# Patient Record
Sex: Male | Born: 1980 | Race: Asian | Hispanic: No | Marital: Married | Smoking: Never smoker
Health system: Southern US, Community
[De-identification: ages and names within clinical notes are randomized; demographics above are authoritative.]

## PROBLEM LIST (undated history)

## (undated) DIAGNOSIS — K219 Gastro-esophageal reflux disease without esophagitis: Secondary | ICD-10-CM

## (undated) HISTORY — DX: Gastro-esophageal reflux disease without esophagitis: K21.9

---

## 2007-03-16 ENCOUNTER — Emergency Department (HOSPITAL_COMMUNITY): Admission: EM | Admit: 2007-03-16 | Discharge: 2007-03-16 | Payer: Self-pay | Admitting: Emergency Medicine

## 2008-03-31 ENCOUNTER — Ambulatory Visit: Payer: Self-pay | Admitting: Family Medicine

## 2008-03-31 DIAGNOSIS — K219 Gastro-esophageal reflux disease without esophagitis: Secondary | ICD-10-CM | POA: Insufficient documentation

## 2008-04-27 ENCOUNTER — Ambulatory Visit: Payer: Self-pay | Admitting: Family Medicine

## 2008-04-27 DIAGNOSIS — R5383 Other fatigue: Secondary | ICD-10-CM

## 2008-04-27 DIAGNOSIS — R5381 Other malaise: Secondary | ICD-10-CM | POA: Insufficient documentation

## 2008-04-28 LAB — CONVERTED CEMR LAB
Albumin: 4.1 g/dL (ref 3.5–5.2)
BUN: 13 mg/dL (ref 6–23)
Calcium: 9.2 mg/dL (ref 8.4–10.5)
Cholesterol: 181 mg/dL (ref 0–200)
Creatinine, Ser: 1 mg/dL (ref 0.4–1.5)
Eosinophils Relative: 5.6 % — ABNORMAL HIGH (ref 0.0–5.0)
GFR calc non Af Amer: 93.93 mL/min (ref >60)
Glucose, Bld: 93 mg/dL (ref 70–99)
HCT: 40.2 % (ref 39.0–52.0)
HDL: 39.5 mg/dL (ref >39.00)
Hemoglobin: 14.2 g/dL (ref 13.0–17.0)
Lymphs Abs: 2.4 10*3/uL (ref 0.7–4.0)
Monocytes Relative: 7.7 % (ref 3.0–12.0)
Neutro Abs: 2.9 10*3/uL (ref 1.4–7.7)
Platelets: 167 10*3/uL (ref 150.0–400.0)
VLDL: 18 mg/dL (ref 0.0–40.0)
WBC: 6.1 10*3/uL (ref 4.5–10.5)

## 2009-03-03 ENCOUNTER — Ambulatory Visit: Payer: Self-pay | Admitting: Family Medicine

## 2009-03-09 ENCOUNTER — Ambulatory Visit: Payer: Self-pay | Admitting: Family Medicine

## 2009-03-09 DIAGNOSIS — L989 Disorder of the skin and subcutaneous tissue, unspecified: Secondary | ICD-10-CM | POA: Insufficient documentation

## 2009-03-09 DIAGNOSIS — D239 Other benign neoplasm of skin, unspecified: Secondary | ICD-10-CM | POA: Insufficient documentation

## 2009-03-20 ENCOUNTER — Ambulatory Visit: Payer: Self-pay | Admitting: Family Medicine

## 2010-02-13 NOTE — Assessment & Plan Note (Signed)
Summary: CPX/CLE   Vital Signs:  Patient profile:   30 year old male Height:      71 inches Weight:      208.4 pounds BMI:     29.17 Temp:     98.2 degrees F Pulse rate:   80 / minute Pulse rhythm:   regular BP sitting:   130 / 90  (left arm) Cuff size:   large  Vitals Entered By: Benny Lennert CMA Duncan Dull) (March 03, 2009 2:43 PM)  Nutrition Counseling: Patient's BMI is greater than 25 and therefore counseled on weight management options.  History of Present Illness: Chief complaint cpx  61 male:  Doing well, question of mole on L shoulder, growing, getting more dark  Preventive Screening-Counseling & Management  Alcohol-Tobacco     Alcohol drinks/day: <1     Alcohol Counseling: not indicated; use of alcohol is not excessive or problematic     Smoking Status: never     Tobacco Counseling: not indicated; no tobacco use  Caffeine-Diet-Exercise     Diet Comments: good     Diet Counseling: to improve diet; diet is suboptimal     Does Patient Exercise: yes     Type of exercise: cycling     Times/week: 5  Hep-HIV-STD-Contraception     STD Risk: no risk noted     Dental Visit-last 6 months yes     Testicular SE Education/Counseling to perform regular STE     Sun Exposure-Excessive: yes     Sun Exposure Counseling: to decrease sun exposure      Sexual History:  currently monogamous.        Drug Use:  never.    Clinical Review Panels:  Lipid Management   Cholesterol:  181 (04/27/2008)   LDL (bad choesterol):  124 (04/27/2008)   HDL (good cholesterol):  39.50 (04/27/2008)  CBC   WBC:  6.1 (04/27/2008)   RBC:  4.66 (04/27/2008)   Hgb:  14.2 (04/27/2008)   Hct:  40.2 (04/27/2008)   Platelets:  167.0 (04/27/2008)   MCV  86.2 (04/27/2008)   MCHC  35.3 (04/27/2008)   RDW  12.3 (04/27/2008)   PMN:  46.6 (04/27/2008)   Lymphs:  39.7 (04/27/2008)   Monos:  7.7 (04/27/2008)   Eosinophils:  5.6 (04/27/2008)   Basophil:  0.4 (04/27/2008)  Complete Metabolic  Panel   Glucose:  93 (04/27/2008)   Sodium:  141 (04/27/2008)   Potassium:  4.0 (04/27/2008)   Chloride:  107 (04/27/2008)   CO2:  30 (04/27/2008)   BUN:  13 (04/27/2008)   Creatinine:  1.0 (04/27/2008)   Albumin:  4.1 (04/27/2008)   Total Protein:  7.3 (04/27/2008)   Calcium:  9.2 (04/27/2008)   Total Bili:  1.2 (04/27/2008)   Alk Phos:  57 (04/27/2008)   SGPT (ALT):  19 (04/27/2008)   SGOT (AST):  19 (04/27/2008)   Allergies (verified): No Known Drug Allergies  Past History:  Past medical, surgical, family and social histories (including risk factors) reviewed, and no changes noted (except as noted below).  Past Medical History: Reviewed history from 03/31/2008 and no changes required. GERD  Past Surgical History: Reviewed history from 03/31/2008 and no changes required. none  Family History: Reviewed history from 03/31/2008 and no changes required. Family History High cholesterol Family History Hypertension DM, uncle hypothyroid, uncle  Social History: Reviewed history from 03/31/2008 and no changes required. Marital Status: Married Children: 1 upcoming Occupation: Teacher, early years/pre Never Smoked Alcohol use-yes Drug use-no Regular exercise-yes (avid cyclist) STD  Risk:  no risk noted Dental Care w/in 6 mos.:  yes Sun Exposure-Excessive:  yes Sexual History:  currently monogamous Drug Use:  never  Review of Systems  General: Denies fever, chills, sweats, anorexia, fatigue, weakness, malaise Eyes: Denies blurring, vision loss ENT: Denies earache, nasal congestion, nosebleeds, sore throat, and hoarseness.  Cardiovascular: Denies chest pains, palpitations, syncope, dyspnea on exertion,  Respiratory: Denies cough, dyspnea at rest, excessive sputum,wheeezing GI: Denies nausea, vomiting, diarrhea, constipation, change in bowel habits, abdominal pain, melena, hematochezia GU: Denies dysuria, hematuria, discharge, urinary frequency, urinary hesitancy, nocturia,  incontinence, genital sores, decreased libido Musculoskeletal: Denies back pain, joint pain Derm: as above Neuro: Denies  paresthesias, frequent falls, frequent headaches, and difficulty walking.  Psych: Denies depression, anxiety Endocrine: Denies cold intolerance, heat intolerance, polydipsia, polyphagia, polyuria, and unusual weight change.  Heme: Denies enlarged lymph nodes Allergy: No hayfever   Otherwise, the pertinent positives and negatives are listed above and in the HPI, otherwise a full review of systems has been reviewed and is negative unless noted positive.   Physical Exam  General:  Well-developed,well-nourished,in no acute distress; alert,appropriate and cooperative throughout examination Head:  Normocephalic and atraumatic without obvious abnormalities. No apparent alopecia or balding. Eyes:  vision grossly intact, pupils equal, pupils round, pupils reactive to light, pupils react to accomodation, and corneas and lenses clear.   Ears:  External ear exam shows no significant lesions or deformities.  Otoscopic examination reveals clear canals, tympanic membranes are intact bilaterally without bulging, retraction, inflammation or discharge. Hearing is grossly normal bilaterally. Nose:  External nasal examination shows no deformity or inflammation. Nasal mucosa are pink and moist without lesions or exudates. Mouth:  Oral mucosa and oropharynx without lesions or exudates.  Teeth in good repair. Neck:  No deformities, masses, or tenderness noted. Chest Wall:  No deformities, masses, tenderness or gynecomastia noted. Lungs:  Normal respiratory effort, chest expands symmetrically. Lungs are clear to auscultation, no crackles or wheezes. Heart:  Normal rate and regular rhythm. S1 and S2 normal without gallop, murmur, click, rub or other extra sounds. Abdomen:  Bowel sounds positive,abdomen soft and non-tender without masses, organomegaly or hernias noted. Genitalia:  Testes  bilaterally descended without nodularity, tenderness or masses. No scrotal masses or lesions. No penis lesions or urethral discharge. Msk:  normal ROM and no crepitation.   Pulses:  R and L carotid are full and equal bilaterally Extremities:  No clubbing, cyanosis, edema, or deformity noted with normal full range of motion of all joints.   Neurologic:  alert & oriented X3, sensation intact to light touch, and gait normal.   Skin:  L shoulder with elevated dark mole with irregular borders Cervical Nodes:  No lymphadenopathy noted Psych:  Cognition and judgment appear intact. Alert and cooperative with normal attention span and concentration. No apparent delusions, illusions, hallucinations   Impression & Recommendations:  Problem # 1:  HEALTH MAINTENANCE EXAM (ICD-V70.0) The patient's preventative maintenance and recommended screening tests for an annual wellness exam were reviewed in full today. Brought up to date unless services declined.  Counselled on the importance of diet, exercise, and its role in overall health and mortality. The patient's FH and SH was reviewed, including their home life, tobacco status, and drug and alcohol status.   L mole concern - I am going to bring him back to do a punch biopsy  Patient Instructions: 1)  follow-up in the next couple of weeks, 30 minute procedure appt.   Prior Medications (reviewed today): None  Current Allergies (reviewed today): No known allergies

## 2010-02-13 NOTE — Assessment & Plan Note (Signed)
Summary: biopsy skin per Dr Dezire Turk/mk   Vital Signs:  Patient profile:   30 year old male Height:      71 inches Weight:      206.4 pounds BMI:     28.89 Temp:     97.3 degrees F oral Pulse rate:   80 / minute Pulse rhythm:   regular BP sitting:   120 / 86  (left arm) Cuff size:   large  Vitals Entered By: Benny Lennert CMA Duncan Dull) (March 09, 2009 10:59 AM)  History of Present Illness: Chief complaint: f/u for ellipitical excision per dr Makynzee Tigges   5 x 6 mm lesion ellipitical L shoulder 2 hm 2 interrupted  PROCEDURE ONLY  MISC. Report  Procedure date:  03/12/2009  Findings:      REPORT OF SURGICAL PATHOLOGY   Case #: [id number] Patient Name: Gary Cobb, Gary Cobb Office Chart Number: [id number]   MRN: [id number] Pathologist: Pershing Proud M.D., Sharlet Salina, Dermatopathologist, Electronic Signature DOB/Age [birth year] (Age: 24) Gender: M Date Taken: 03/09/2009 Date Received: 03/09/2009   FINAL DIAGNOSIS ***Microscopic Examination and Diagnosis***   1. SKIN, LEFT SHOULDER : MELANOCYTIC NEVUS,  HYPERPIGMENTED, SEE DESCRIPTION   DATE REPORTED: 03/10/2009 *** Electronically Signed Out by Stahr M.D., Sharlet Salina, Dermatopathologist, Electronic Signature ***   CLINICAL HISTORY   SPECIMEN(S) OBTAINED 1. Skin, Left Shoulder   SPECIMEN COMMENTS: 1. Atypical nevus, r/o melanoma, atypical scc   Gross Description 1. Formalin fixed specimen received:  8 x 5 x 3 mm, toto (3 p) (1 b) ( as )   MICROSCOPIC DESCRIPTION 1. At low magnification this proliferation of cells has pigment superficially.  There is a symmetrical growth pattern with sharp lateral demarcation and no conspicuous intraepidermal spread.  A rare junctional melanocyte is observed but the dermal component is most obvious.  I see one mitotic figure superficially in this material but the lesion again exhibits a symmetrical growth pattern with maturation towards the base.  Some of the nevus cells extend into the  reticular dermis in a splayed pattern suggesting a congential origin.  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  A solitary mitotic figure superficially located probably has no significance.  I do not see expansile growth.  I do not see deep mitotic figures.  I do not see intralesional transformation. Based on this histology i favor a melanocytic nevus, hyperpigmented.    Allergies (verified): No Known Drug Allergies   Impression & Recommendations:  Problem # 1:  SKIN LESION (ICD-709.9) PROCEDURE ONLY  Biopsy, as below, elliptical biopsy with 2 mm margins.   Sent to pathology and reviewed. Results called to patient.  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 Disposition: To home

## 2010-02-13 NOTE — Assessment & Plan Note (Signed)
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

## 2011-01-29 ENCOUNTER — Encounter: Payer: Self-pay | Admitting: Family Medicine

## 2011-01-30 ENCOUNTER — Encounter: Payer: Self-pay | Admitting: Family Medicine

## 2011-01-30 ENCOUNTER — Encounter: Payer: Self-pay | Admitting: *Deleted

## 2011-01-30 ENCOUNTER — Ambulatory Visit (INDEPENDENT_AMBULATORY_CARE_PROVIDER_SITE_OTHER): Payer: Managed Care, Other (non HMO) | Admitting: Family Medicine

## 2011-01-30 VITALS — BP 120/82 | HR 69 | Temp 97.8°F | Wt 182.2 lb

## 2011-01-30 DIAGNOSIS — S060X0A Concussion without loss of consciousness, initial encounter: Secondary | ICD-10-CM
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
## 2011-02-05 ENCOUNTER — Telehealth: Payer: Self-pay | Admitting: *Deleted

## 2011-02-05 NOTE — Telephone Encounter (Signed)
Patient says he saw you last week and was asked to call with a progress report.  He says he is feeling much better, not 100% but much better.  He is returning to work Advertising account executive.

## 2011-02-05 NOTE — Telephone Encounter (Signed)
noted 

## 2011-11-08 ENCOUNTER — Encounter: Payer: Self-pay | Admitting: Family Medicine

## 2011-11-08 ENCOUNTER — Ambulatory Visit (INDEPENDENT_AMBULATORY_CARE_PROVIDER_SITE_OTHER): Payer: BC Managed Care – PPO | Admitting: Family Medicine

## 2011-11-08 VITALS — BP 110/78 | HR 84 | Temp 98.6°F | Wt 179.5 lb

## 2011-11-08 DIAGNOSIS — R509 Fever, unspecified: Secondary | ICD-10-CM

## 2011-11-08 DIAGNOSIS — J029 Acute pharyngitis, unspecified: Secondary | ICD-10-CM

## 2011-11-08 DIAGNOSIS — S63509A Unspecified sprain of unspecified wrist, initial encounter: Secondary | ICD-10-CM

## 2011-11-08 DIAGNOSIS — S66919A Strain of unspecified muscle, fascia and tendon at wrist and hand level, unspecified hand, initial encounter: Secondary | ICD-10-CM

## 2011-11-08 LAB — POCT RAPID STREP A (OFFICE): Rapid Strep A Screen: NEGATIVE

## 2011-11-08 MED ORDER — AMOXICILLIN 875 MG PO TABS: 875.0000 mg | ORAL_TABLET | Freq: Two times a day (BID) | ORAL | Status: DC

## 2011-11-08 NOTE — Progress Notes (Signed)
  Subjective:    Patient ID: Gary Cobb, male    DOB: Jan 14, 1981, 31 y.o.   MRN: [id number]  HPI CC: ST, wrist pain  2d ago started having fever (highest 39 celsius (102.45F) yesterday) with swollen glands.  Now with bad ST.  + chills and HA.  Has been treating with pseudophed.  Mild lightheaded and nauseated this morning.  No cough, congestion, sneezing, RN.  No abd pain, n/v, myalgias, arthralgias.  Children recently started daycare - sick at home. Mother in law smokes at home, outside. No h/o asthma/allergies.  Having wrist pain over last 4-5 days - points to ulnar wrist.  Works at pharmacy - types, opening bottles.  Using wrist braces bilaterally which do help.  Worse pain with ulnar deviation.  Denies paresthesias or weakness in arms/hands.  No neck pain.  Past Medical History  Diagnosis Date  . GERD (gastroesophageal reflux disease)      Review of Systems Per HPI    Objective:   Physical Exam  Nursing note and vitals reviewed. Constitutional: He appears well-developed and well-nourished. No distress.  HENT:  Head: Normocephalic and atraumatic.  Right Ear: Hearing, tympanic membrane, external ear and ear canal normal.  Left Ear: Hearing, tympanic membrane, external ear and ear canal normal.  Nose: Nose normal. No mucosal edema or rhinorrhea. Right sinus exhibits no maxillary sinus tenderness and no frontal sinus tenderness. Left sinus exhibits no maxillary sinus tenderness and no frontal sinus tenderness.  Mouth/Throat: Uvula is midline and mucous membranes are normal. Posterior oropharyngeal edema and posterior oropharyngeal erythema present. No oropharyngeal exudate or tonsillar abscesses.       Erythematous tonsils, some exudate on right side  Eyes: Conjunctivae normal and EOM are normal. Pupils are equal, round, and reactive to light. No scleral icterus.  Neck: Normal range of motion. Neck supple. No thyromegaly present.  Cardiovascular: Normal rate, regular rhythm,  normal heart sounds and intact distal pulses.   No murmur heard. Pulmonary/Chest: Effort normal and breath sounds normal. No respiratory distress. He has no wheezes. He has no rales.  Musculoskeletal:       Minimal pain along TFCC bilaterally. Mild pain with flexion and supination against resistance bilaterally No pain with extension against resistance. No deformity, erythema, induration.  Lymphadenopathy:       Head (right side): Submandibular adenopathy present.       Head (left side): Submandibular adenopathy present.    He has cervical adenopathy.       Right cervical: Posterior cervical adenopathy present.       Left cervical: Posterior cervical adenopathy present.       Tender shotty LAD  Skin: Skin is warm and dry. No rash noted.       Assessment & Plan:

## 2011-11-08 NOTE — Assessment & Plan Note (Signed)
4/4 centor criteria but negative RST. Treat regardless given presentation with amoxicillin course. Update [place] if not improving as expected. Pt agrees with plan.

## 2011-11-08 NOTE — Patient Instructions (Signed)
You have pharyngitis, possibly strep.  Treat with amoxicillin twice daily for 10 days. Push fluids and plenty of rest. May use ibuprofen for throat inflammation and wrist. Salt water gargles. Suck on cold things like popsicles or warm things like herbal teas (whichever soothes the throat better). Return if fever >101.5, worsening pain, or trouble opening/closing mouth, or hoarse voice. For wrist - I think you have wrist sprain.  Treat with ibuprofen and continued wrist support and stretching exercises provided today. Good to see you today, call clinic with questions.

## 2011-11-08 NOTE — Assessment & Plan Note (Signed)
Anticipate TFCC ligament sprain. Provided with stretching exercises from [facility] pt advisor. Use ibuprofen prn.  Use wrist brace for work.  Ice wrist. Update if not improved with this treatment.

## 2011-11-15 ENCOUNTER — Ambulatory Visit (INDEPENDENT_AMBULATORY_CARE_PROVIDER_SITE_OTHER): Payer: BC Managed Care – PPO | Admitting: Family Medicine

## 2011-11-15 ENCOUNTER — Encounter: Payer: Self-pay | Admitting: Family Medicine

## 2011-11-15 VITALS — BP 116/74 | HR 60 | Temp 98.0°F | Wt 177.8 lb

## 2011-11-15 DIAGNOSIS — M25562 Pain in left knee: Secondary | ICD-10-CM | POA: Insufficient documentation

## 2011-11-15 DIAGNOSIS — M25569 Pain in unspecified knee: Secondary | ICD-10-CM

## 2011-11-15 DIAGNOSIS — M25561 Pain in right knee: Secondary | ICD-10-CM | POA: Insufficient documentation

## 2011-11-15 NOTE — Patient Instructions (Addendum)
I think you have runner's knee or possibly resolving baker's cyst. Continue ice to knee, continue aleve (naproxen). Rest knee for the next week, then may try slowly restart training for running.  If pain returns, return to see Dr. Patsy Lager. Good to see you today, call us with questions.
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
## 2011-11-15 NOTE — Assessment & Plan Note (Addendum)
Right greater than left.  Benign exam today. Anticipate patellofemoral pain syndrome, worse with running. rec rest knee for 1 week , continue OTC NSAID, continue ice. May use knee sleeve for more support. Do stretching exercises provided After 1 wk rest, may restart training slowly.  If pain returns or worsens, rec rtc to see PCP, Dr. Salena Saner.

## 2012-01-24 ENCOUNTER — Encounter: Payer: Self-pay | Admitting: Family Medicine

## 2012-01-24 ENCOUNTER — Ambulatory Visit (INDEPENDENT_AMBULATORY_CARE_PROVIDER_SITE_OTHER): Payer: BC Managed Care – PPO | Admitting: Family Medicine

## 2012-01-24 VITALS — BP 118/78 | HR 60 | Temp 98.1°F | Wt 189.2 lb

## 2012-01-24 DIAGNOSIS — J069 Acute upper respiratory infection, unspecified: Secondary | ICD-10-CM | POA: Insufficient documentation

## 2012-01-24 MED ORDER — AZITHROMYCIN 250 MG PO TABS: ORAL_TABLET | ORAL | Status: DC

## 2012-01-24 MED ORDER — BENZONATATE 100 MG PO CAPS
100.0000 mg | ORAL_CAPSULE | Freq: Three times a day (TID) | ORAL | Status: DC | PRN
Start: 2012-01-24 — End: 2012-08-26

## 2012-01-24 NOTE — Patient Instructions (Signed)
I think you have a post-infectious cough. Use medication as prescribed: tessalon perls.  Start antihistamine.  Use ibuprofen as needed for bronchi inflammation. Push fluids and plenty of rest. If not improving over next several days, fill zpack (script provided). Call clinic with questions.  Good to see you today.

## 2012-01-24 NOTE — Assessment & Plan Note (Signed)
Anticipate post-infectious (post-viral) cough.  Discussed this. rec antihistamine, continue tessalon perls, continue mucinex DM. If not better with this, rec fill zpack to cover atypical bronchitis given duration of cough. Pt agrees with plan.

## 2012-01-24 NOTE — Progress Notes (Signed)
  Subjective:    Patient ID: Gary Cobb, male    DOB: [birth year], 32 y.o.   MRN: [id number]  HPI CC: cold sxs  2 wk h/o cough productive of green phlegm worse in am, cold sxs including rhinorrhea.  + chest congestion.  + PNdrainage.  sleeping ok at night.  Minimal coughing fits.  Denies diaphoresis.  Denies post tussive emesis.  plateauing sxs.  So far has tried mucinex DM and tessalon perls.  No fevers/chills, significant congestion, HA, abd pain, nausea/vomiting, ear or tooth pain, rashes, myalgias, arthralgias (other than knees).  Denies SOB, wheezing, chest tightness.  Did receive flu shot this year. Wife sick recently with similar sxs, treated with zpack. No smokers at home. No h/o asthma.  Past Medical History  Diagnosis Date  . GERD (gastroesophageal reflux disease)      Review of Systems Per HPI    Objective:   Physical Exam  Nursing note and vitals reviewed. Constitutional: He appears well-developed and well-nourished. No distress.  HENT:  Head: Normocephalic and atraumatic.  Right Ear: Hearing, tympanic membrane, external ear and ear canal normal.  Left Ear: Hearing, tympanic membrane, external ear and ear canal normal.  Nose: Nose normal. No mucosal edema or rhinorrhea. Right sinus exhibits no maxillary sinus tenderness and no frontal sinus tenderness. Left sinus exhibits no maxillary sinus tenderness and no frontal sinus tenderness.  Mouth/Throat: Uvula is midline and mucous membranes are normal. Posterior oropharyngeal edema and posterior oropharyngeal erythema present. No oropharyngeal exudate or tonsillar abscesses.       Some nasal congestion present + white posterior oropharynx drainage present  Eyes: Conjunctivae normal and EOM are normal. Pupils are equal, round, and reactive to light. No scleral icterus.  Neck: Normal range of motion. Neck supple.  Cardiovascular: Normal rate, regular rhythm, normal heart sounds and intact distal pulses.   No murmur  heard. Pulmonary/Chest: Effort normal and breath sounds normal. No respiratory distress. He has no wheezes. He has no rales.  Lymphadenopathy:    He has no cervical adenopathy.  Skin: Skin is warm and dry. No rash noted.      Assessment & Plan:

## 2012-08-26 ENCOUNTER — Ambulatory Visit (INDEPENDENT_AMBULATORY_CARE_PROVIDER_SITE_OTHER): Payer: BC Managed Care – PPO | Admitting: Family Medicine

## 2012-08-26 ENCOUNTER — Encounter: Payer: Self-pay | Admitting: Family Medicine

## 2012-08-26 VITALS — BP 118/80 | HR 56 | Temp 97.8°F | Ht 71.0 in | Wt 183.0 lb

## 2012-08-26 DIAGNOSIS — M25549 Pain in joints of unspecified hand: Secondary | ICD-10-CM

## 2012-08-26 DIAGNOSIS — M25541 Pain in joints of right hand: Secondary | ICD-10-CM | POA: Insufficient documentation

## 2012-08-26 DIAGNOSIS — M25542 Pain in joints of left hand: Secondary | ICD-10-CM | POA: Insufficient documentation

## 2012-08-26 LAB — CBC WITH DIFFERENTIAL/PLATELET
Basophils Relative: 0.4 % (ref 0.0–3.0)
Eosinophils Absolute: 0.2 10*3/uL (ref 0.0–0.7)
Hemoglobin: 15 g/dL (ref 13.0–17.0)
Lymphs Abs: 2.2 10*3/uL (ref 0.7–4.0)
MCHC: 33.2 g/dL (ref 30.0–36.0)
MCV: 87.1 fl (ref 78.0–100.0)
Monocytes Absolute: 0.5 10*3/uL (ref 0.1–1.0)
Neutro Abs: 4.4 10*3/uL (ref 1.4–7.7)
RBC: 5.2 Mil/uL (ref 4.22–5.81)

## 2012-08-26 LAB — COMPREHENSIVE METABOLIC PANEL
AST: 17 U/L (ref 0–37)
Albumin: 4.4 g/dL (ref 3.5–5.2)
BUN: 15 mg/dL (ref 6–23)
Calcium: 9.7 mg/dL (ref 8.4–10.5)
Chloride: 103 mEq/L (ref 96–112)
Potassium: 4.2 mEq/L (ref 3.5–5.1)
Total Protein: 7.7 g/dL (ref 6.0–8.3)

## 2012-08-26 MED ORDER — IBUPROFEN 800 MG PO TABS: 800.0000 mg | ORAL_TABLET | Freq: Three times a day (TID) | ORAL | Status: DC | PRN

## 2012-08-26 NOTE — Progress Notes (Signed)
  Subjective:    Patient ID: Gary Cobb, male    DOB: [birth year], 32 y.o.   MRN: [id number]  HPI   32 year old male pt of Dr. Patsy Lager presents with new pain in  Left middle PIP joint...swollen and tender in last week.  In last week had some tenderness in B thumb DIP... Better now, but tender in right to palpation  No redness and no swelling.  No fatigue, no fever, no rash.  No new meds, no recent viral infection. Had flu shot three weeks ago.  Father and brother with gout. ? OA in father. No autoimmune diseases.   Has not taken any med for symptoms.   Review of Systems  Constitutional: Negative for fever and fatigue.  HENT: Negative for ear pain.   Eyes: Negative for pain.  Respiratory: Negative for cough and shortness of breath.   Cardiovascular: Negative for chest pain, palpitations and leg swelling.  Gastrointestinal: Negative for abdominal pain.  Genitourinary: Negative for dysuria.       Objective:   Physical Exam  Constitutional: Vital signs are normal. He appears well-developed and well-nourished.  HENT:  Head: Normocephalic.  Right Ear: Hearing normal.  Left Ear: Hearing normal.  Nose: Nose normal.  Mouth/Throat: Oropharynx is clear and moist and mucous membranes are normal.  Neck: Trachea normal. Carotid bruit is not present. No mass and no thyromegaly present.  Cardiovascular: Normal rate, regular rhythm and normal pulses.  Exam reveals no gallop, no distant heart sounds and no friction rub.   No murmur heard. No peripheral edema  Pulmonary/Chest: Effort normal and breath sounds normal. No respiratory distress.  Musculoskeletal:  Swelling and tenderness in left 3rd digit PIP joint.. Minimal warmth and erythema. Slight swelling in right thumb IP joint  Skin: Skin is warm, dry and intact. No rash noted.  Psychiatric: He has a normal mood and affect. His speech is normal and behavior is normal. Thought content normal.          Assessment & Plan:  He  has significant swelling in PIP joint on left.. No known injury. Wille val for causes of arthritis with labs. May be reactive but no known triggering illness.  Treat with NSAID.

## 2012-08-26 NOTE — Patient Instructions (Signed)
Ibuprofen 800 mg every 8 hour as needed. Stop at lab on your way out. Call with results.

## 2012-10-12 ENCOUNTER — Other Ambulatory Visit: Payer: Self-pay | Admitting: Family Medicine

## 2012-10-12 DIAGNOSIS — Z13220 Encounter for screening for lipoid disorders: Secondary | ICD-10-CM

## 2012-10-14 ENCOUNTER — Other Ambulatory Visit: Payer: BC Managed Care – PPO

## 2012-10-16 ENCOUNTER — Other Ambulatory Visit (INDEPENDENT_AMBULATORY_CARE_PROVIDER_SITE_OTHER): Payer: BC Managed Care – PPO

## 2012-10-16 DIAGNOSIS — Z13220 Encounter for screening for lipoid disorders: Secondary | ICD-10-CM

## 2012-10-16 LAB — LIPID PANEL
Cholesterol: 187 mg/dL (ref 0–200)
HDL: 61.4 mg/dL (ref >39.00)
LDL Cholesterol: 113 mg/dL — ABNORMAL HIGH (ref 0–99)
Triglycerides: 65 mg/dL (ref 0.0–149.0)
VLDL: 13 mg/dL (ref 0.0–40.0)

## 2012-10-21 ENCOUNTER — Ambulatory Visit (INDEPENDENT_AMBULATORY_CARE_PROVIDER_SITE_OTHER): Payer: BC Managed Care – PPO | Admitting: Family Medicine

## 2012-10-21 ENCOUNTER — Encounter: Payer: Self-pay | Admitting: Family Medicine

## 2012-10-21 VITALS — BP 128/78 | HR 73 | Temp 98.4°F | Ht 70.5 in | Wt 184.2 lb

## 2012-10-21 DIAGNOSIS — Z0000 Encounter for general adult medical examination without abnormal findings: Secondary | ICD-10-CM

## 2012-10-21 MED ORDER — OXAPROZIN 600 MG PO TABS: 1200.0000 mg | ORAL_TABLET | Freq: Every day | ORAL | Status: DC

## 2012-10-21 NOTE — Progress Notes (Signed)
[facility] [address] [place] [address] Phone: [phone] Fax: [phone]  Date:  10/21/2012   Name:  Gary Cobb   DOB:  06-08-1980   MRN:  [id number] Gender: male Age: 32 y.o.  Primary Physician:  Hannah Beat, MD   Chief Complaint: Annual Exam   History of Present Illness:  Gary Cobb is a 32 y.o. pleasant patient who presents with the following:  CPX:  Took some ibuprofen, but only took tid for 3 days. Also stiff in the morning as well. Diffuse hands, had rheum labs checked a few months ago which were negative.  Preventative Health Maintenance Visit:  Health Maintenance Summary Reviewed and updated, unless pt declines services.  Tobacco History Reviewed. Alcohol: No concerns, no excessive use Exercise Habits: biking quite a bit STD concerns: no risk or activity to increase risk Drug Use: None Encouraged self-testicular check  Health Maintenance  Topic Date Due  . Tetanus/tdap  05/15/1998  . Influenza Vaccine  08/14/2012    Labs reviewed with the patient.  Results for orders placed in visit on 10/16/12  LIPID PANEL      Result Value Range   Cholesterol 187  0 - 200 mg/dL   Triglycerides 28.4  0.0 - 149.0 mg/dL   HDL 13.24  >40.10 mg/dL   VLDL 27.2  0.0 - 53.6 mg/dL   LDL Cholesterol 644 (*) 0 - 99 mg/dL   Total CHOL/HDL Ratio 3       Lipids:    Component Value Date/Time   CHOL 187 10/16/2012 0915   TRIG 65.0 10/16/2012 0915   HDL 61.40 10/16/2012 0915   VLDL 13.0 10/16/2012 0915   CHOLHDL 3 10/16/2012 0915    CBC:    Component Value Date/Time   WBC 7.3 08/26/2012 1145   HGB 15.0 08/26/2012 1145   HCT 45.3 08/26/2012 1145   PLT 183.0 08/26/2012 1145   MCV 87.1 08/26/2012 1145   NEUTROABS 4.4 08/26/2012 1145   LYMPHSABS 2.2 08/26/2012 1145   MONOABS 0.5 08/26/2012 1145   EOSABS 0.2 08/26/2012 1145   BASOSABS 0.0 08/26/2012 1145    Basic Metabolic Panel:    Component Value Date/Time   NA 138  08/26/2012 1145   K 4.2 08/26/2012 1145   CL 103 08/26/2012 1145   CO2 30 08/26/2012 1145   BUN 15 08/26/2012 1145   CREATININE 1.0 08/26/2012 1145   GLUCOSE 92 08/26/2012 1145   CALCIUM 9.7 08/26/2012 1145    Lab Results  Component Value Date   ALT 16 08/26/2012   AST 17 08/26/2012   ALKPHOS 53 08/26/2012   BILITOT 1.1 08/26/2012    No results found for this basename: PSA     Patient Active Problem List   Diagnosis Date Noted  . Joint pain in fingers of left hand 08/26/2012  . Joint pain in fingers of right hand 08/26/2012  . Knee pain, bilateral 11/15/2011  . NEVUS, ATYPICAL 03/09/2009  . SKIN LESION 03/09/2009  . OTHER MALAISE AND FATIGUE 04/27/2008  . GERD 03/31/2008    Past Medical History  Diagnosis Date  . GERD (gastroesophageal reflux disease)     No past surgical history on file.  History   Social History  . Marital Status: Married    Spouse Name: N/A    Number of Children: 1  . Years of Education: N/A   Occupational History  . pharmacist    Social History Main Topics  . Smoking status: Never Smoker   .  Smokeless tobacco: Never Used  . Alcohol Use: Yes     Comment: Rare  . Drug Use: No  . Sexual Activity: Not on file   Other Topics Concern  . Not on file   Social History Narrative   Occupation: works at [employer] Scientist, research (physical sciences))   Regular exercise- yes-avid cyclist    No family history on file.  No Known Allergies  Medication list has been reviewed and updated.  Outpatient Prescriptions Prior to Visit  Medication Sig Dispense Refill  . ibuprofen (ADVIL,MOTRIN) 800 MG tablet Take 1 tablet (800 mg total) by mouth every 8 (eight) hours as needed for pain.  30 tablet  0   No facility-administered medications prior to visit.    Review of Systems:   General: Denies fever, chills, sweats. No significant weight loss. Eyes: Denies blurring,significant itching ENT: Denies earache, sore throat, and hoarseness. Cardiovascular: Denies chest pains,  palpitations, dyspnea on exertion Respiratory: Denies cough, dyspnea at rest,wheeezing Breast: no concerns about lumps GI: Denies nausea, vomiting, diarrhea, constipation, change in bowel habits, abdominal pain, melena, hematochezia GU: Denies penile discharge, ED, urinary flow / outflow problems. No STD concerns. Musculoskeletal: hand pain as noted Derm: Denies rash, itching Neuro: Denies  paresthesias, frequent falls, frequent headaches Psych: Denies depression, anxiety Endocrine: Denies cold intolerance, heat intolerance, polydipsia Heme: Denies enlarged lymph nodes Allergy: No hayfever   Physical Examination: BP 128/78  Pulse 73  Temp(Src) 98.4 F (36.9 C) (Oral)  Ht 5' 10.5" (1.791 m)  Wt 184 lb 4 oz (83.575 kg)  BMI 26.05 kg/m2  Ideal Body Weight: Weight in (lb) to have BMI = 25: 176.4  GEN: well developed, well nourished, no acute distress Eyes: conjunctiva and lids normal, PERRLA, EOMI ENT: TM clear, nares clear, oral exam WNL Neck: supple, no lymphadenopathy, no thyromegaly, no JVD Pulm: clear to auscultation and percussion, respiratory effort normal CV: regular rate and rhythm, S1-S2, no murmur, rub or gallop, no bruits, peripheral pulses normal and symmetric, no cyanosis, clubbing, edema or varicosities GI: soft, non-tender; no hepatosplenomegaly, masses; active bowel sounds all quadrants GU: no hernia, testicular mass, penile discharge Lymph: no cervical, axillary or inguinal adenopathy MSK: gait normal, muscle tone and strength WNL, no joint swelling, effusions, discoloration, crepitus  SKIN: clear, good turgor, color WNL, no rashes, lesions, or ulcerations Neuro: normal mental status, normal strength, sensation, and motion Psych: alert; oriented to person, place and time, normally interactive and not anxious or depressed in appearance.  Assessment and Plan:  Routine general medical examination at a health care facility  The patient's preventative maintenance  and recommended screening tests for an annual wellness exam were reviewed in full today. Brought up to date unless services declined.  Counselled on the importance of diet, exercise, and its role in overall health and mortality. The patient's FH and SH was reviewed, including their home life, tobacco status, and drug and alcohol status.   Hand aches, some knee pain - slowing down his training now, start with nsaids 2-3 weeks  Orders Today:  No orders of the defined types were placed in this encounter.    Updated Medication List: (Includes new medications, updates to list, dose adjustments) Meds ordered this encounter  Medications  . oxaprozin (DAYPRO) 600 MG tablet    Sig: Take 2 tablets (1,200 mg total) by mouth daily.    Dispense:  60 tablet    Refill:  1    Medications Discontinued: There are no discontinued medications.    Signed,  Karleen Hampshire  Celedonio Savage, MD

## 2013-11-12 ENCOUNTER — Encounter: Payer: Self-pay | Admitting: Family Medicine

## 2013-11-12 ENCOUNTER — Ambulatory Visit (INDEPENDENT_AMBULATORY_CARE_PROVIDER_SITE_OTHER): Payer: BC Managed Care – PPO | Admitting: Family Medicine

## 2013-11-12 VITALS — BP 119/72 | HR 84 | Temp 98.5°F | Ht 70.5 in | Wt 182.8 lb

## 2013-11-12 DIAGNOSIS — J209 Acute bronchitis, unspecified: Secondary | ICD-10-CM | POA: Insufficient documentation

## 2013-11-12 DIAGNOSIS — J208 Acute bronchitis due to other specified organisms: Secondary | ICD-10-CM

## 2013-11-12 MED ORDER — AZITHROMYCIN 250 MG PO TABS: ORAL_TABLET | ORAL | Status: DC

## 2013-11-12 NOTE — Progress Notes (Signed)
   Subjective:    Patient ID: Gary Cobb, male    DOB: [birth year], 33 y.o.   MRN: [id number]  Cough This is a new problem. The current episode started 1 to 4 weeks ago. The problem has been waxing and waning. The cough is productive of sputum. Associated symptoms include headaches, nasal congestion and rhinorrhea. Pertinent negatives include no chills, ear congestion, ear pain, fever, postnasal drip, rash, sore throat, shortness of breath or wheezing. Associated symptoms comments: No sinus pressure. The symptoms are aggravated by lying down. Risk factors: nonsmoker. Treatments tried: antihistamines, cough med, mucinex D. The treatment provided mild relief. His past medical history is significant for asthma. There is no history of COPD, emphysema, environmental allergies or pneumonia. as child  Sinusitis Associated symptoms include coughing and headaches. Pertinent negatives include no chills, ear pain, shortness of breath or sore throat.      Review of Systems  Constitutional: Negative for fever and chills.  HENT: Positive for rhinorrhea. Negative for ear pain, postnasal drip and sore throat.   Respiratory: Positive for cough. Negative for shortness of breath and wheezing.   Skin: Negative for rash.  Allergic/Immunologic: Negative for environmental allergies.  Neurological: Positive for headaches.       Objective:   Physical Exam  Constitutional: Vital signs are normal. He appears well-developed and well-nourished.  Non-toxic appearance. He does not appear ill. No distress.  HENT:  Head: Normocephalic and atraumatic.  Right Ear: Hearing, tympanic membrane, external ear and ear canal normal. No tenderness. No foreign bodies. Tympanic membrane is not retracted and not bulging.  Left Ear: Hearing, tympanic membrane, external ear and ear canal normal. No tenderness. No foreign bodies. Tympanic membrane is not retracted and not bulging.  Nose: Nose normal. No mucosal edema or rhinorrhea.  Right sinus exhibits no maxillary sinus tenderness and no frontal sinus tenderness. Left sinus exhibits no maxillary sinus tenderness and no frontal sinus tenderness.  Mouth/Throat: Uvula is midline, oropharynx is clear and moist and mucous membranes are normal. Normal dentition. No dental caries. No oropharyngeal exudate or tonsillar abscesses.  Eyes: Conjunctivae, EOM and lids are normal. Pupils are equal, round, and reactive to light. Lids are everted and swept, no foreign bodies found.  Neck: Trachea normal, normal range of motion and phonation normal. Neck supple. Carotid bruit is not present. No mass and no thyromegaly present.  Cardiovascular: Normal rate, regular rhythm, S1 normal, S2 normal, normal heart sounds, intact distal pulses and normal pulses.  Exam reveals no gallop.   No murmur heard. Pulmonary/Chest: Effort normal and breath sounds normal. No respiratory distress. He has no wheezes. He has no rhonchi. He has no rales.  Abdominal: Soft. Normal appearance and bowel sounds are normal. There is no hepatosplenomegaly. There is no tenderness. There is no rebound, no guarding and no CVA tenderness. No hernia.  Neurological: He is alert. He has normal reflexes.  Skin: Skin is warm, dry and intact. No rash noted.  Psychiatric: He has a normal mood and affect. His speech is normal and behavior is normal. Judgment normal.          Assessment & Plan:

## 2013-11-12 NOTE — Patient Instructions (Addendum)
Complete antibiotics. Push fluids, rest. Mucinex DM and nasal saline irrigation. Call if not improving , go to [facility] ER if severe shortness of breath.

## 2013-11-12 NOTE — Assessment & Plan Note (Signed)
Given length of time of symptoms not improving. Will treat with antibiotics to cover bacterial infection.

## 2013-11-12 NOTE — Progress Notes (Signed)
Pre visit review using our clinic review tool, if applicable. No additional management support is needed unless otherwise documented below in the visit note. 

## 2013-12-22 ENCOUNTER — Ambulatory Visit (INDEPENDENT_AMBULATORY_CARE_PROVIDER_SITE_OTHER): Payer: BC Managed Care – PPO | Admitting: Family Medicine

## 2013-12-22 ENCOUNTER — Ambulatory Visit (HOSPITAL_BASED_OUTPATIENT_CLINIC_OR_DEPARTMENT_OTHER)
Admission: RE | Admit: 2013-12-22 | Discharge: 2013-12-22 | Disposition: A | Discharge status: Home / Self Care | Payer: BC Managed Care – PPO | Source: Ambulatory Visit | Attending: Family Medicine | Admitting: Family Medicine

## 2013-12-22 ENCOUNTER — Encounter: Payer: Self-pay | Admitting: Family Medicine

## 2013-12-22 ENCOUNTER — Ambulatory Visit: Payer: BC Managed Care – PPO | Admitting: Family Medicine

## 2013-12-22 VITALS — BP 122/80 | HR 82 | Temp 97.7°F | Resp 16 | Wt 184.0 lb

## 2013-12-22 DIAGNOSIS — M25531 Pain in right wrist: Secondary | ICD-10-CM

## 2013-12-22 MED ORDER — MELOXICAM 15 MG PO TABS: 15.0000 mg | ORAL_TABLET | Freq: Every day | ORAL | Status: DC

## 2013-12-22 NOTE — Patient Instructions (Signed)
Follow up as needed w/ Dr Lorelei Pont (he is a sports medicine doctor) Go downstairs and get your xray- we'll notify you of your results If it is fractured (broken), we'll arrange for you to see orthopedics or sports medicine If no fracture, wear the wrist splint for comfort and to allow healing.  Wear the splint for 5-7 days, removing for ice and gentle stretching Start the Mobic once daily (take w/ food) for pain and inflammation Call with any questions or concerns Hang in there!

## 2013-12-22 NOTE — Progress Notes (Signed)
Pre visit review using our clinic review tool, if applicable. No additional management support is needed unless otherwise documented below in the visit note. 

## 2013-12-22 NOTE — Progress Notes (Signed)
   Subjective:    Patient ID: Gary Cobb, male    DOB: 12-16-1980, 33 y.o.   MRN: [id number]  HPI R wrist pain- occurred yesterday while biking.  Pt is R hand dominant.  Pt pulled up on handle bar for bunny hop and 'felt something' then when landed 'it hurt even more' in wrist.  No swelling.  Applied ice yesterday.  On NSAID.  Pain w/ pronation/supination.  Pain is ulnar and radiates up into arm.  No pain w/ making a fist.  Pain w/ wrist extension.   Review of Systems For ROS see HPI     Objective:   Physical Exam  Constitutional: He is oriented to person, place, and time. He appears well-developed and well-nourished. No distress.  Cardiovascular: Intact distal pulses.   Musculoskeletal: He exhibits edema (mild soft tissue swelling over ulnar aspect of R wrist) and tenderness (over R distal ulna and adjacent carpals).  Pain w/ pronation/suppination of R wrist Full flexion/extension w/ minimal discomfort Good radial/ulnar deviation  Neurological: He is alert and oriented to person, place, and time. He has normal reflexes. Coordination normal.  Vitals reviewed.         Assessment & Plan:

## 2013-12-26 NOTE — Assessment & Plan Note (Signed)
New.  Pt felt pop in R wrist and then worsening pain after landing bike jump.  Pt continues to have discomfort- mostly w/ pronation/suppination- and mild edema.  Suspect soft tissue injury as there was no direct bony trauma.  NSAIDs prn.  Ice.  Xray to r/o fx.  Pt to continue wrist brace prn for comfort.  Pt expressed understanding and is in agreement w/ plan.

## 2014-02-09 ENCOUNTER — Encounter: Payer: Self-pay | Admitting: Family Medicine

## 2014-02-09 ENCOUNTER — Ambulatory Visit (INDEPENDENT_AMBULATORY_CARE_PROVIDER_SITE_OTHER): Payer: BLUE CROSS/BLUE SHIELD | Admitting: Family Medicine

## 2014-02-09 VITALS — BP 116/80 | HR 61 | Temp 98.0°F | Ht 70.75 in | Wt 179.0 lb

## 2014-02-09 DIAGNOSIS — Z0000 Encounter for general adult medical examination without abnormal findings: Secondary | ICD-10-CM

## 2014-02-09 DIAGNOSIS — Z131 Encounter for screening for diabetes mellitus: Secondary | ICD-10-CM

## 2014-02-09 DIAGNOSIS — Z13220 Encounter for screening for lipoid disorders: Secondary | ICD-10-CM

## 2014-02-09 DIAGNOSIS — R5383 Other fatigue: Secondary | ICD-10-CM

## 2014-02-09 DIAGNOSIS — M25531 Pain in right wrist: Secondary | ICD-10-CM

## 2014-02-09 LAB — CBC WITH DIFFERENTIAL/PLATELET
BASOS PCT: 0.8 % (ref 0.0–3.0)
Basophils Absolute: 0 10*3/uL (ref 0.0–0.1)
Eosinophils Absolute: 0.5 10*3/uL (ref 0.0–0.7)
Eosinophils Relative: 8.8 % — ABNORMAL HIGH (ref 0.0–5.0)
HCT: 42.7 % (ref 39.0–52.0)
Hemoglobin: 14.8 g/dL (ref 13.0–17.0)
LYMPHS ABS: 2 10*3/uL (ref 0.7–4.0)
Lymphocytes Relative: 39.2 % (ref 12.0–46.0)
MCHC: 34.5 g/dL (ref 30.0–36.0)
MCV: 84.3 fl (ref 78.0–100.0)
Monocytes Absolute: 0.4 10*3/uL (ref 0.1–1.0)
Monocytes Relative: 7.4 % (ref 3.0–12.0)
Neutro Abs: 2.3 10*3/uL (ref 1.4–7.7)
Neutrophils Relative %: 43.8 % (ref 43.0–77.0)
Platelets: 196 10*3/uL (ref 150.0–400.0)
RBC: 5.07 Mil/uL (ref 4.22–5.81)
RDW: 14 % (ref 11.5–15.5)
WBC: 5.2 10*3/uL (ref 4.0–10.5)

## 2014-02-09 LAB — LIPID PANEL
CHOL/HDL RATIO: 3
Cholesterol: 178 mg/dL (ref 0–200)
HDL: 64 mg/dL (ref >39.00)
LDL Cholesterol: 99 mg/dL (ref 0–99)
NONHDL: 114
TRIGLYCERIDES: 73 mg/dL (ref 0.0–149.0)
VLDL: 14.6 mg/dL (ref 0.0–40.0)

## 2014-02-09 LAB — BASIC METABOLIC PANEL
BUN: 13 mg/dL (ref 6–23)
CALCIUM: 9.6 mg/dL (ref 8.4–10.5)
CO2: 29 mEq/L (ref 19–32)
CREATININE: 1.03 mg/dL (ref 0.40–1.50)
Chloride: 102 mEq/L (ref 96–112)
GFR: 87.48 mL/min (ref >60.00)
GLUCOSE: 104 mg/dL — AB (ref 70–99)
Potassium: 4.3 mEq/L (ref 3.5–5.1)
Sodium: 138 mEq/L (ref 135–145)

## 2014-02-09 LAB — HEPATIC FUNCTION PANEL
ALT: 13 U/L (ref 0–53)
AST: 17 U/L (ref 0–37)
Albumin: 4.3 g/dL (ref 3.5–5.2)
Alkaline Phosphatase: 48 U/L (ref 39–117)
Bilirubin, Direct: 0.2 mg/dL (ref 0.0–0.3)
Total Bilirubin: 1 mg/dL (ref 0.2–1.2)
Total Protein: 7.5 g/dL (ref 6.0–8.3)

## 2014-02-09 NOTE — Progress Notes (Signed)
Dr. Frederico Hamman T. Kaoru Rezendes, MD, [facility] [place], [address] Phone: [phone] Fax: [phone]  02/09/2014  Patient: Gary Cobb, MRN: [id number], DOB: [birth year], 34 y.o.  Primary Physician:  Owens Loffler, MD  Chief Complaint: Annual Exam  Subjective:   Gary Cobb is a 34 y.o. pleasant patient who presents with the following:  Preventative Health Maintenance Visit:  Health Maintenance Summary Reviewed and updated, unless pt declines services.  Tobacco History Reviewed. Alcohol: No concerns, no excessive use Exercise Habits: high level activity, rec at least 30 mins 5 times a week STD concerns: no risk or activity to increase risk Drug Use: None Encouraged self-testicular check  Hurt week about a month ago. R wrist.  Ice.  Hit head on the tree mountain biking in December. Felt ok, did not feel all that bad. Three days after had some blurred vision with some silver spots. At the end of his shifts.   Vision changed? Sits in the same chair - digital clock. Noticed it is a little different.  Check eyes?   Health Maintenance  Topic Date Due  . TETANUS/TDAP  05/15/1998  . INFLUENZA VACCINE  08/14/2013   Immunization History  Administered Date(s) Administered  . Influenza Split 11/28/2011  . Influenza-Unspecified 08/10/2013  . Pneumococcal Polysaccharide-23 01/04/2013   Patient Active Problem List   Diagnosis Date Noted  . Wrist pain, right 12/22/2013  . Acute bronchitis 11/12/2013  . Joint pain in fingers of left hand 08/26/2012  . Joint pain in fingers of right hand 08/26/2012  . Knee pain, bilateral 11/15/2011  . NEVUS, ATYPICAL 03/09/2009  . SKIN LESION 03/09/2009  . OTHER MALAISE AND FATIGUE 04/27/2008  . GERD 03/31/2008   Past Medical History  Diagnosis Date  . GERD (gastroesophageal reflux disease)    No past surgical history on file. History   Social History  . Marital  Status: Married    Spouse Name: N/A    Number of Children: 1  . Years of Education: N/A   Occupational History  . pharmacist    Social History Main Topics  . Smoking status: Never Smoker   . Smokeless tobacco: Never Used  . Alcohol Use: Yes     Comment: Rare  . Drug Use: No  . Sexual Activity: Not on file   Other Topics Concern  . 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 No Known Allergies  Medication list has been reviewed and updated.   General: Denies fever, chills, sweats. No significant weight loss. Eyes: ABOVE ENT: Denies earache, sore throat, and hoarseness. Cardiovascular: Denies chest pains, palpitations, dyspnea on exertion Respiratory: Denies cough, dyspnea at rest,wheeezing Breast: no concerns about lumps GI: Denies nausea, vomiting, diarrhea, constipation, change in bowel habits, abdominal pain, melena, hematochezia GU: Denies penile discharge, ED, urinary flow / outflow problems. No STD concerns. Musculoskeletal: ABOVE Derm: Denies rash, itching Neuro: Denies  paresthesias, frequent falls, frequent headaches Psych: Denies depression, anxiety Endocrine: Denies cold intolerance, heat intolerance, polydipsia Heme: Denies enlarged lymph nodes Allergy: No hayfever  Objective:   BP 116/80 mmHg  Pulse 61  Temp(Src) 98 F (36.7 C) (Oral)  Ht 5' 10.75" (1.797 m)  Wt 179 lb (81.194 kg)  BMI 25.14 kg/m2 Ideal Body Weight: Weight in (lb) to have BMI = 25: 177.6  No exam data present  GEN:  well developed, well nourished, no acute distress Eyes: conjunctiva and lids normal, PERRLA, EOMI ENT: TM clear, nares clear, oral exam WNL Neck: supple, no lymphadenopathy, no thyromegaly, no JVD Pulm: clear to auscultation and percussion, respiratory effort normal CV: regular rate and rhythm, S1-S2, no murmur, rub or gallop, no bruits, peripheral pulses  normal and symmetric, no cyanosis, clubbing, edema or varicosities GI: soft, non-tender; no hepatosplenomegaly, masses; active bowel sounds all quadrants GU: no hernia, testicular mass, penile discharge Lymph: no cervical, axillary or inguinal adenopathy MSK: gait normal, muscle tone and strength WNL, no joint swelling, effusions, discoloration, crepitus   Right wrist: No swelling. No bruising. Nontender throughout all metacarpals and fingers. Nontender along the ulna and radius. There is some mild tenderness with palpation in the distal ulna near the TFCC. Minimal pain with axial loading. Some pain with supination.  SKIN: clear, good turgor, color WNL, no rashes, lesions, or ulcerations Neuro: normal mental status, normal strength, sensation, and motion Psych: alert; oriented to person, place and time, normally interactive and not anxious or depressed in appearance. All labs reviewed with patient.  Lipids:    Component Value Date/Time   CHOL 187 10/16/2012 0915   TRIG 65.0 10/16/2012 0915   HDL 61.40 10/16/2012 0915   VLDL 13.0 10/16/2012 0915   CHOLHDL 3 10/16/2012 0915   CBC: CBC Latest Ref Rng 08/26/2012 04/27/2008  WBC 4.5 - 10.5 K/uL 7.3 6.1  Hemoglobin 13.0 - 17.0 g/dL 15.0 14.2  Hematocrit 39.0 - 52.0 % 45.3 40.2  Platelets 150.0 - 400.0 K/uL 183.0 161.0    Basic Metabolic Panel:    Component Value Date/Time   NA 138 08/26/2012 1145   K 4.2 08/26/2012 1145   CL 103 08/26/2012 1145   CO2 30 08/26/2012 1145   BUN 15 08/26/2012 1145   CREATININE 1.0 08/26/2012 1145   GLUCOSE 92 08/26/2012 1145   CALCIUM 9.7 08/26/2012 1145   Hepatic Function Latest Ref Rng 08/26/2012 04/27/2008  Total Protein 6.0 - 8.3 g/dL 7.7 7.3  Albumin 3.5 - 5.2 g/dL 4.4 4.1  AST 0 - 37 U/L 17 19  ALT 0 - 53 U/L 16 19  Alk Phosphatase 39 - 117 U/L 53 57  Total Bilirubin 0.3 - 1.2 mg/dL 1.1 1.2  Bilirubin, Direct 0.0-0.3 mg/dL - 0.1    Lab Results  Component Value Date   TSH 0.76 08/26/2012     No results found for: PSA  Assessment and Plan:   Healthcare maintenance  Screening, lipid - Plan: Lipid panel  Screening for diabetes mellitus - Plan: Basic metabolic panel  Other fatigue - Plan: CBC with Differential/Platelet, Hepatic function panel  Health Maintenance Exam: The patient's preventative maintenance and recommended screening tests for an annual wellness exam were reviewed in full today. Brought up to date unless services declined.  Counselled on the importance of diet, exercise, and its role in overall health and mortality. The patient's FH and SH was reviewed, including their home life, tobacco status, and drug and alcohol status.  I reassured him about his wrist. It is approximately 99% better. Possibly could have a small TFCC tear or he could have a healing ligament tear in his carpal region.  Follow-up: No Follow-up on file. Unless noted, follow-up in 1 year for Health Maintenance Exam.  New Prescriptions   No medications on file   Orders Placed This Encounter  Procedures  . Basic metabolic panel  . CBC with Differential/Platelet  . Hepatic function panel  . Lipid panel  Signed,  Maud Deed. Kellene Mccleary, MD   Patient's Medications  New Prescriptions   No medications on file  Previous Medications   No medications on file  Modified Medications   No medications on file  Discontinued Medications   MELOXICAM (MOBIC) 15 MG TABLET    Take 1 tablet (15 mg total) by mouth daily.   OXAPROZIN (DAYPRO) 600 MG TABLET    Take 2 tablets (1,200 mg total) by mouth daily.

## 2014-02-09 NOTE — Progress Notes (Signed)
Pre visit review using our clinic review tool, if applicable. No additional management support is needed unless otherwise documented below in the visit note. 

## 2014-02-10 ENCOUNTER — Encounter: Payer: Self-pay | Admitting: *Deleted

## 2014-06-09 ENCOUNTER — Encounter: Payer: Self-pay | Admitting: Family Medicine

## 2014-06-09 ENCOUNTER — Ambulatory Visit (INDEPENDENT_AMBULATORY_CARE_PROVIDER_SITE_OTHER): Payer: BLUE CROSS/BLUE SHIELD | Admitting: Family Medicine

## 2014-06-09 VITALS — BP 100/70 | HR 65 | Temp 98.2°F | Ht 70.75 in | Wt 180.8 lb

## 2014-06-09 DIAGNOSIS — M7541 Impingement syndrome of right shoulder: Secondary | ICD-10-CM | POA: Diagnosis not present

## 2014-06-09 DIAGNOSIS — G2589 Other specified extrapyramidal and movement disorders: Secondary | ICD-10-CM | POA: Diagnosis not present

## 2014-06-09 NOTE — Progress Notes (Signed)
Pre visit review using our clinic review tool, if applicable. No additional management support is needed unless otherwise documented below in the visit note. 

## 2014-06-09 NOTE — Progress Notes (Signed)
   Dr. Frederico Hamman T. Kaylor Maiers, MD, [facility] [place], [address] Phone: [phone] Fax: [phone]  06/09/2014  Patient: Gary Cobb, MRN: [id number], DOB: 06-Mar-1980, 34 y.o.  Primary Physician:  Owens Loffler, MD  Chief Complaint: Shoulder Pain  Subjective:   Gary Cobb is a 34 y.o. very pleasant male patient who presents with the following:  The patient noted above presents with shoulder pain that has been ongoing for 1 month.  there is no history of trauma or accident. The patient denies neck pain or radicular symptoms. Denies dislocation, subluxation, separation of the shoulder. The patient does not complain of pain in the overhead plane.  Medications Tried: none Ice or Heat: none Tried PT: No  Prior shoulder Injury: No Prior surgery: No Prior fracture: No  Handedness:  R  R shoulder has been hurting has been hurting. Some pain with abduction. A little - no accident. No known injury.   Small sebaceous cyst on testicular sac.   Past Medical History, Surgical History, Social History, Family History, Problem List, Medications, and Allergies have been reviewed and updated if relevant.  GEN: No fevers, chills. Nontoxic. Primarily MSK c/o today. MSK: Detailed in the HPI GI: tolerating PO intake without difficulty Neuro: No numbness, parasthesias, or tingling associated. Otherwise the pertinent positives of the ROS are noted above.   Objective:   BP 100/70 mmHg  Pulse 65  Temp(Src) 98.2 F (36.8 C) (Oral)  Ht 5' 10.75" (1.797 m)  Wt 180 lb 12 oz (81.988 kg)  BMI 25.39 kg/m2   GEN: Well-developed,well-nourished,in no acute distress; alert,appropriate and cooperative throughout examination HEENT: Normocephalic and atraumatic without obvious abnormalities. Ears, externally no deformities PULM: Breathing comfortably in no respiratory distress EXT: No clubbing, cyanosis, or edema PSYCH:  Normally interactive. Cooperative during the interview. Pleasant. Friendly and conversant. Not anxious or depressed appearing. Normal, full affect.  Shoulder: R Inspection: No muscle wasting or winging Ecchymosis/edema: neg  AC joint, scapula, clavicle: NT Cervical spine: NT, full ROM Spurling's: neg Abduction: full, 5/5 Flexion: full, 5/5 IR, full, lift-off: 5/5 ER at neutral: full, 5/5 AC crossover: neg Neer: pos Hawkins: pos Drop Test: neg Empty Can: pos Supraspinatus insertion: mild-mod T Bicipital groove: NT Speed's: neg Yergason's: neg Sulcus sign: neg Scapular dyskinesis: dramatic winging on push-ups C5-T1 intact  Neuro: Sensation intact Grip 5/5   Radiology: No results found.  Assessment and Plan:   Impingement syndrome of right shoulder - Plan: Ambulatory referral to Physical Therapy  Scapular dyskinesis - Plan: Ambulatory referral to Physical Therapy  >25 minutes spent in face to face time with patient, >50% spent in counselling or coordination of care  Refer to the patient instructions sections for details of plan shared with patient.   Reviewed scapular dyskinesis and relevant anatomy. Start RTC and scap stabilization and formal PT for now.  Reassure about small sebaceous cust on testicular sac  Orders Placed This Encounter  Procedures  . Ambulatory referral to Physical Therapy   Patient Instructions  Scapular Stabilization  Scapular Dyskinesis Rehab     Signed,  Frederico Hamman T. Xyla Leisner, MD   Patient's Medications  New Prescriptions   No medications on file  Previous Medications   MULTIPLE VITAMINS-MINERALS (MULTIVITAMIN WITH MINERALS) TABLET    Take 1 tablet by mouth daily.  Modified Medications   No medications on file  Discontinued Medications   No medications on file

## 2014-06-09 NOTE — Patient Instructions (Signed)
Scapular Stabilization  Scapular Dyskinesis Rehab

## 2014-11-10 ENCOUNTER — Encounter: Payer: Self-pay | Admitting: Family Medicine

## 2014-11-10 ENCOUNTER — Ambulatory Visit (INDEPENDENT_AMBULATORY_CARE_PROVIDER_SITE_OTHER): Payer: BLUE CROSS/BLUE SHIELD | Admitting: Family Medicine

## 2014-11-10 VITALS — BP 104/72 | HR 65 | Temp 97.6°F | Ht 70.75 in | Wt 181.2 lb

## 2014-11-10 DIAGNOSIS — M2390 Unspecified internal derangement of unspecified knee: Secondary | ICD-10-CM

## 2014-11-10 DIAGNOSIS — J069 Acute upper respiratory infection, unspecified: Secondary | ICD-10-CM

## 2014-11-10 DIAGNOSIS — G2589 Other specified extrapyramidal and movement disorders: Secondary | ICD-10-CM

## 2014-11-10 DIAGNOSIS — M238X9 Other internal derangements of unspecified knee: Secondary | ICD-10-CM

## 2014-11-10 MED ORDER — DICLOFENAC SODIUM 1 % TD GEL: 4.0000 g | Freq: Four times a day (QID) | TRANSDERMAL | Status: DC
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
## 2014-11-10 NOTE — Progress Notes (Signed)
Pre visit review using our clinic review tool, if applicable. No additional management support is needed unless otherwise documented below in the visit note. 

## 2014-12-07 ENCOUNTER — Ambulatory Visit (INDEPENDENT_AMBULATORY_CARE_PROVIDER_SITE_OTHER): Payer: BLUE CROSS/BLUE SHIELD | Admitting: Family Medicine

## 2014-12-07 ENCOUNTER — Encounter: Payer: Self-pay | Admitting: Family Medicine

## 2014-12-07 VITALS — BP 110/60 | HR 69 | Temp 98.2°F | Ht 70.75 in | Wt 179.3 lb

## 2014-12-07 DIAGNOSIS — Z13220 Encounter for screening for lipoid disorders: Secondary | ICD-10-CM | POA: Diagnosis not present

## 2014-12-07 DIAGNOSIS — R739 Hyperglycemia, unspecified: Secondary | ICD-10-CM | POA: Diagnosis not present

## 2014-12-07 DIAGNOSIS — I781 Nevus, non-neoplastic: Secondary | ICD-10-CM

## 2014-12-07 DIAGNOSIS — R591 Generalized enlarged lymph nodes: Secondary | ICD-10-CM

## 2014-12-07 NOTE — Progress Notes (Signed)
Dr. Frederico Hamman T. Yanci Bachtell, MD, [facility] Medicine [place], [address] Phone: [phone] Fax: [phone]  12/07/2014  Patient: Gary Cobb, MRN: [id number], DOB: [birth year], 34 y.o.  Primary Physician:  Owens Loffler, MD   Chief Complaint  Patient presents with  . Lump on Left Side    Noticed 3 weeks ago  . Dark Spots    On Left Arm and Right Leg   Subjective:   Gary Cobb is a 34 y.o. very pleasant male patient who presents with the following:  He had a few worries.  Lymph nodes, shotty in the neck  Cyst l side, of side  Dark spots, moles mildly elevated but no other changes  Past Medical History, Surgical History, Social History, Family History, Problem List, Medications, and Allergies have been reviewed and updated if relevant.  Patient Active Problem List   Diagnosis Date Noted  . Wrist pain, right 12/22/2013  . NEVUS, ATYPICAL 03/09/2009  . SKIN LESION 03/09/2009  . GERD 03/31/2008    Past Medical History  Diagnosis Date  . GERD (gastroesophageal reflux disease)     No past surgical history on file.  Social History   Social History  . Marital Status: Married    Spouse Name: N/A  . Number of Children: 1  . Years of Education: N/A   Occupational History  . pharmacist    Social History Main Topics  . Smoking status: Never Smoker   . Smokeless tobacco: Never Used  . Alcohol Use: Yes     Comment: Rare  . Drug Use: No  . Sexual Activity: Not on file   Other Topics Concern  . 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  No Known Allergies  Medication list reviewed and updated in full in [place].   GEN: No acute illnesses, no fevers, chills. GI: No n/v/d, eating normally Pulm: No SOB Interactive and getting along well at home.  Otherwise, ROS is as per  the HPI.  Objective:   BP 110/60 mmHg  Pulse 69  Temp(Src) 98.2 F (36.8 C) (Oral)  Ht 5' 10.75" (1.797 m)  Wt 179 lb 5 oz (81.336 kg)  BMI 25.19 kg/m2  GEN: WDWN, NAD, Non-toxic, A & O x 3 HEENT: Atraumatic, Normocephalic. Neck supple. No masses, minimal ant chain LAD. Ears and Nose: No external deformity. CV: RRR, No M/G/R. No JVD. No thrill. No extra heart sounds. PULM: CTA B, no wheezes, crackles, rhonchi. No retractions. No resp. distress. No accessory muscle use. Abd: lateral flank, small cyst EXTR: No c/c/e NEURO Normal gait.  PSYCH: Normally interactive. Conversant. Not depressed or anxious appearing.  Calm demeanor.  Skin, nevus L arm and 2 on thigh  Laboratory and Imaging Data:  Assessment and Plan:   Lymphadenopathy - Plan: CBC with Differential/Platelet, Hepatic function panel  Screening, lipid - Plan: Lipid panel  Hyperglycemia - Plan: Basic metabolic panel  Nevus, non-neoplastic  i am reassured - tried to reassure him Check basic labs  Follow-up: No Follow-up on file.  New Prescriptions   No medications on file   Modified Medications   No medications on file   Orders Placed This Encounter  Procedures  . Basic metabolic panel  . CBC with Differential/Platelet  . Hepatic function panel  . Lipid panel    Signed,  Julianna Vanwagner T. Kemp Gomes, MD   Patient's Medications  New Prescriptions   No medications on file  Previous Medications   MULTIPLE VITAMINS-MINERALS (MULTIVITAMIN WITH MINERALS) TABLET    Take 1 tablet by mouth daily.  Modified Medications   No medications on file  Discontinued Medications   DICLOFENAC SODIUM (VOLTAREN) 1 % GEL    Apply 4 g topically 4 (four) times daily.

## 2014-12-07 NOTE — Progress Notes (Signed)
Pre visit review using our clinic review tool, if applicable. No additional management support is needed unless otherwise documented below in the visit note. 

## 2014-12-12 ENCOUNTER — Encounter: Payer: Self-pay | Admitting: *Deleted

## 2014-12-12 ENCOUNTER — Other Ambulatory Visit (INDEPENDENT_AMBULATORY_CARE_PROVIDER_SITE_OTHER): Payer: BLUE CROSS/BLUE SHIELD

## 2014-12-12 DIAGNOSIS — Z13220 Encounter for screening for lipoid disorders: Secondary | ICD-10-CM | POA: Diagnosis not present

## 2014-12-12 DIAGNOSIS — R591 Generalized enlarged lymph nodes: Secondary | ICD-10-CM | POA: Diagnosis not present

## 2014-12-12 DIAGNOSIS — R739 Hyperglycemia, unspecified: Secondary | ICD-10-CM

## 2014-12-12 LAB — LIPID PANEL
CHOLESTEROL: 166 mg/dL (ref 0–200)
HDL: 56 mg/dL (ref >39.00)
LDL Cholesterol: 98 mg/dL (ref 0–99)
NonHDL: 109.87
TRIGLYCERIDES: 60 mg/dL (ref 0.0–149.0)
Total CHOL/HDL Ratio: 3
VLDL: 12 mg/dL (ref 0.0–40.0)

## 2014-12-12 LAB — CBC WITH DIFFERENTIAL/PLATELET
BASOS PCT: 0.5 % (ref 0.0–3.0)
Basophils Absolute: 0 10*3/uL (ref 0.0–0.1)
EOS ABS: 0.4 10*3/uL (ref 0.0–0.7)
Eosinophils Relative: 6.8 % — ABNORMAL HIGH (ref 0.0–5.0)
HEMATOCRIT: 44.3 % (ref 39.0–52.0)
Hemoglobin: 14.4 g/dL (ref 13.0–17.0)
Lymphocytes Relative: 38.1 % (ref 12.0–46.0)
Lymphs Abs: 2 10*3/uL (ref 0.7–4.0)
MCHC: 32.5 g/dL (ref 30.0–36.0)
MCV: 86.6 fl (ref 78.0–100.0)
MONO ABS: 0.4 10*3/uL (ref 0.1–1.0)
Monocytes Relative: 7.6 % (ref 3.0–12.0)
Neutro Abs: 2.4 10*3/uL (ref 1.4–7.7)
Neutrophils Relative %: 47 % (ref 43.0–77.0)
PLATELETS: 174 10*3/uL (ref 150.0–400.0)
RBC: 5.11 Mil/uL (ref 4.22–5.81)
RDW: 13.5 % (ref 11.5–15.5)
WBC: 5.2 10*3/uL (ref 4.0–10.5)

## 2014-12-12 LAB — BASIC METABOLIC PANEL
BUN: 15 mg/dL (ref 6–23)
CO2: 30 mEq/L (ref 19–32)
CREATININE: 1.06 mg/dL (ref 0.40–1.50)
Calcium: 9.5 mg/dL (ref 8.4–10.5)
Chloride: 104 mEq/L (ref 96–112)
GFR: 84.22 mL/min (ref >60.00)
Glucose, Bld: 98 mg/dL (ref 70–99)
Potassium: 4.1 mEq/L (ref 3.5–5.1)
Sodium: 139 mEq/L (ref 135–145)

## 2014-12-12 LAB — HEPATIC FUNCTION PANEL
ALBUMIN: 4.1 g/dL (ref 3.5–5.2)
ALT: 12 U/L (ref 0–53)
AST: 17 U/L (ref 0–37)
Alkaline Phosphatase: 50 U/L (ref 39–117)
BILIRUBIN DIRECT: 0.1 mg/dL (ref 0.0–0.3)
TOTAL PROTEIN: 7 g/dL (ref 6.0–8.3)
Total Bilirubin: 0.9 mg/dL (ref 0.2–1.2)

## 2015-09-07 ENCOUNTER — Other Ambulatory Visit: Payer: Self-pay | Admitting: Family Medicine
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
## 2016-12-19 ENCOUNTER — Other Ambulatory Visit: Payer: Self-pay

## 2016-12-19 ENCOUNTER — Encounter: Payer: Self-pay | Admitting: Family Medicine

## 2016-12-19 ENCOUNTER — Ambulatory Visit (INDEPENDENT_AMBULATORY_CARE_PROVIDER_SITE_OTHER): Payer: 59 | Admitting: Family Medicine

## 2016-12-19 VITALS — BP 96/70 | HR 55 | Temp 98.0°F | Ht 70.5 in | Wt 164.5 lb

## 2016-12-19 DIAGNOSIS — Z0000 Encounter for general adult medical examination without abnormal findings: Secondary | ICD-10-CM | POA: Diagnosis not present

## 2016-12-19 LAB — BASIC METABOLIC PANEL
BUN: 10 mg/dL (ref 6–23)
CHLORIDE: 103 meq/L (ref 96–112)
CO2: 30 meq/L (ref 19–32)
Calcium: 9.1 mg/dL (ref 8.4–10.5)
Creatinine, Ser: 0.98 mg/dL (ref 0.40–1.50)
GFR: 91.17 mL/min (ref >60.00)
Glucose, Bld: 84 mg/dL (ref 70–99)
POTASSIUM: 4.1 meq/L (ref 3.5–5.1)
SODIUM: 139 meq/L (ref 135–145)

## 2016-12-19 LAB — CBC WITH DIFFERENTIAL/PLATELET
BASOS PCT: 0.9 % (ref 0.0–3.0)
Basophils Absolute: 0 10*3/uL (ref 0.0–0.1)
EOS ABS: 0.3 10*3/uL (ref 0.0–0.7)
EOS PCT: 6.8 % — AB (ref 0.0–5.0)
HEMATOCRIT: 42.2 % (ref 39.0–52.0)
HEMOGLOBIN: 13.9 g/dL (ref 13.0–17.0)
LYMPHS PCT: 50.1 % — AB (ref 12.0–46.0)
Lymphs Abs: 2.3 10*3/uL (ref 0.7–4.0)
MCHC: 33 g/dL (ref 30.0–36.0)
MCV: 87.9 fl (ref 78.0–100.0)
MONOS PCT: 7.5 % (ref 3.0–12.0)
Monocytes Absolute: 0.3 10*3/uL (ref 0.1–1.0)
Neutro Abs: 1.6 10*3/uL (ref 1.4–7.7)
Neutrophils Relative %: 34.7 % — ABNORMAL LOW (ref 43.0–77.0)
Platelets: 174 10*3/uL (ref 150.0–400.0)
RBC: 4.8 Mil/uL (ref 4.22–5.81)
RDW: 13.7 % (ref 11.5–15.5)
WBC: 4.6 10*3/uL (ref 4.0–10.5)

## 2016-12-19 LAB — HEPATIC FUNCTION PANEL
ALT: 15 U/L (ref 0–53)
AST: 18 U/L (ref 0–37)
Albumin: 4.2 g/dL (ref 3.5–5.2)
Alkaline Phosphatase: 47 U/L (ref 39–117)
BILIRUBIN DIRECT: 0.2 mg/dL (ref 0.0–0.3)
BILIRUBIN TOTAL: 1.4 mg/dL — AB (ref 0.2–1.2)
Total Protein: 6.8 g/dL (ref 6.0–8.3)

## 2016-12-19 LAB — LIPID PANEL
CHOL/HDL RATIO: 2
Cholesterol: 154 mg/dL (ref 0–200)
HDL: 63.5 mg/dL (ref >39.00)
LDL CALC: 82 mg/dL (ref 0–99)
NonHDL: 90.6
TRIGLYCERIDES: 41 mg/dL (ref 0.0–149.0)
VLDL: 8.2 mg/dL (ref 0.0–40.0)

## 2016-12-19 NOTE — Progress Notes (Signed)
Dr. Frederico Hamman T. Jadarion Halbig, MD, [facility] and Sports Medicine [place], [address] Phone: [phone] Fax: [phone]  12/19/2016  Patient: Gary Cobb, MRN: [id number], DOB: 19-Jun-1980, 36 y.o.  Primary Physician:  Owens Loffler, MD   Chief Complaint  Patient presents with  . Annual Exam   Subjective:   Gary Cobb is a 36 y.o. pleasant patient who presents with the following:  Preventative Health Maintenance Visit:  Health Maintenance Summary Reviewed and updated, unless pt declines services.  Tobacco History Reviewed. Alcohol: No concerns, no excessive use Exercise Habits: Some activity, rec at least 30 mins 5 times a week STD concerns: no risk or activity to increase risk Drug Use: None Encouraged self-testicular check  Tdap - at work  Flu - at work  Has some knee crepitus.   Biking routinely for fitness. Doing core exercises every morning.    Health Maintenance  Topic Date Due  . HIV Screening  05/15/1994  . TETANUS/TDAP  12/20/2026  . INFLUENZA VACCINE  Completed   Immunization History  Administered Date(s) Administered  . Influenza Split 11/28/2011  . Influenza,inj,Quad PF,6+ Mos 09/29/2015  . Influenza-Unspecified 08/10/2013, 09/12/2014  . Pneumococcal Polysaccharide-23 01/04/2013   Patient Active Problem List   Diagnosis Date Noted  . Wrist pain, right 12/22/2013  . NEVUS, ATYPICAL 03/09/2009  . SKIN LESION 03/09/2009  . GERD 03/31/2008   Past Medical History:  Diagnosis Date  . GERD (gastroesophageal reflux disease)    History reviewed. No pertinent surgical history. Social History   Socioeconomic History  . Marital status: Married    Spouse name: Not on file  . Number of children: 1  . Years of education: Not on file  . Highest education level: Not on file  Social Needs  . Financial resource strain: Not on file  . Food insecurity - worry: Not on file  . Food insecurity -  inability: Not on file  . Transportation needs - medical: Not on file  . Transportation needs - non-medical: Not on file  Occupational History  . Occupation: pharmacist  Tobacco Use  . Smoking status: Never Smoker  . Smokeless tobacco: Never Used  Substance and Sexual Activity  . Alcohol use: Yes    Comment: Rare  . Drug use: No  . Sexual activity: Not on file  Other Topics Concern  . 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 No pertinent family history. No Known Allergies  Medication list has been reviewed and updated.   General: Denies fever, chills, sweats. No significant weight loss. Eyes: Denies blurring,significant itching ENT: Denies earache, sore throat, and hoarseness. Cardiovascular: Denies chest pains, palpitations, dyspnea on exertion Respiratory: Denies cough, dyspnea at rest,wheeezing Breast: no concerns about lumps GI: Denies nausea, vomiting, diarrhea, constipation, change in bowel habits, abdominal pain, melena, hematochezia GU: Denies penile discharge, ED, urinary flow / outflow problems. No STD concerns. Musculoskeletal: Denies back pain, joint pain Derm: Denies rash, itching Neuro: Denies  paresthesias, frequent falls, frequent headaches Psych: Denies depression, anxiety Endocrine: Denies cold intolerance, heat intolerance, polydipsia Heme: Denies enlarged lymph nodes Allergy: No hayfever  Objective:   BP 96/70   Pulse (!) 55   Temp 98 F (36.7 C) (Oral)   Ht 5' 10.5" (1.791 m)   Wt 164 lb 8 oz (74.6 kg)   BMI 23.27 kg/m  Ideal Body Weight: Weight in (lb)  to have BMI = 25: 176.4  No exam data present  GEN: well developed, well nourished, no acute distress Eyes: conjunctiva and lids normal, PERRLA, EOMI ENT: TM clear, nares clear, oral exam WNL Neck: supple, no lymphadenopathy, no thyromegaly, no JVD Pulm: clear to auscultation and percussion,  respiratory effort normal CV: regular rate and rhythm, S1-S2, no murmur, rub or gallop, no bruits, peripheral pulses normal and symmetric, no cyanosis, clubbing, edema or varicosities GI: soft, non-tender; no hepatosplenomegaly, masses; active bowel sounds all quadrants GU: no hernia, testicular mass, penile discharge Lymph: no cervical, axillary or inguinal adenopathy MSK: gait normal, muscle tone and strength WNL, no joint swelling, effusions, discoloration, crepitus  SKIN: clear, good turgor, color WNL, no rashes, lesions, or ulcerations Neuro: normal mental status, normal strength, sensation, and motion Psych: alert; oriented to person, place and time, normally interactive and not anxious or depressed in appearance. All labs reviewed with patient.  Lipids:    Component Value Date/Time   CHOL 166 12/12/2014 0916   TRIG 60.0 12/12/2014 0916   HDL 56.00 12/12/2014 0916   VLDL 12.0 12/12/2014 0916   CHOLHDL 3 12/12/2014 0916   CBC: CBC Latest Ref Rng & Units 12/12/2014 02/09/2014 08/26/2012  WBC 4.0 - 10.5 K/uL 5.2 5.2 7.3  Hemoglobin 13.0 - 17.0 g/dL 14.4 14.8 15.0  Hematocrit 39.0 - 52.0 % 44.3 42.7 45.3  Platelets 150.0 - 400.0 K/uL 174.0 196.0 485.4    Basic Metabolic Panel:    Component Value Date/Time   NA 139 12/12/2014 0916   K 4.1 12/12/2014 0916   CL 104 12/12/2014 0916   CO2 30 12/12/2014 0916   BUN 15 12/12/2014 0916   CREATININE 1.06 12/12/2014 0916   GLUCOSE 98 12/12/2014 0916   CALCIUM 9.5 12/12/2014 0916   Hepatic Function Latest Ref Rng & Units 12/12/2014 02/09/2014 08/26/2012  Total Protein 6.0 - 8.3 g/dL 7.0 7.5 7.7  Albumin 3.5 - 5.2 g/dL 4.1 4.3 4.4  AST 0 - 37 U/L '17 17 17  ' ALT 0 - 53 U/L '12 13 16  ' Alk Phosphatase 39 - 117 U/L 50 48 53  Total Bilirubin 0.2 - 1.2 mg/dL 0.9 1.0 1.1  Bilirubin, Direct 0.0 - 0.3 mg/dL 0.1 0.2 -    Lab Results  Component Value Date   TSH 0.76 08/26/2012   No results found for: PSA  Assessment and Plan:    Healthcare maintenance - Plan: Basic metabolic panel, CBC with Differential/Platelet, Hepatic function panel, Lipid panel  Health Maintenance Exam: The patient's preventative maintenance and recommended screening tests for an annual wellness exam were reviewed in full today. Brought up to date unless services declined.  Counselled on the importance of diet, exercise, and its role in overall health and mortality. The patient's FH and SH was reviewed, including their home life, tobacco status, and drug and alcohol status.  Follow-up in 1 year for physical exam or additional follow-up below.  Follow-up: No Follow-up on file. Or follow-up in 1 year if not noted.  Orders Placed This Encounter  Procedures  . Basic metabolic panel  . CBC with Differential/Platelet  . Hepatic function panel  . Lipid panel    Signed,  Frederico Hamman T. Kathey Simer, MD   Allergies as of 12/19/2016   No Known Allergies     Medication List        Accurate as of 12/19/16  1:14 PM. Always use your most recent med list.          meloxicam 15  MG tablet Commonly known as:  MOBIC take 1 tablet by mouth once daily   multivitamin with minerals tablet Take 1 tablet by mouth daily.

## 2016-12-25 ENCOUNTER — Encounter: Payer: Self-pay | Admitting: *Deleted

## 2017-12-03 ENCOUNTER — Ambulatory Visit: Payer: 59 | Admitting: Family Medicine

## 2017-12-31 ENCOUNTER — Encounter: Payer: Self-pay | Admitting: Family Medicine

## 2017-12-31 ENCOUNTER — Telehealth: Payer: Self-pay | Admitting: Family Medicine

## 2017-12-31 NOTE — Telephone Encounter (Signed)
Left message asking pt to call office please let pt know his appointment with dr copland has been changed to 01/26/18 @ 11  Mailed letter

## 2018-01-13 ENCOUNTER — Other Ambulatory Visit: Payer: Self-pay | Admitting: Family Medicine

## 2018-01-13 DIAGNOSIS — Z114 Encounter for screening for human immunodeficiency virus [HIV]: Secondary | ICD-10-CM

## 2018-01-13 DIAGNOSIS — Z0000 Encounter for general adult medical examination without abnormal findings: Secondary | ICD-10-CM

## 2018-01-19 ENCOUNTER — Encounter: Payer: 59 | Admitting: Family Medicine

## 2018-01-23 ENCOUNTER — Other Ambulatory Visit (INDEPENDENT_AMBULATORY_CARE_PROVIDER_SITE_OTHER): Payer: 59

## 2018-01-23 DIAGNOSIS — Z114 Encounter for screening for human immunodeficiency virus [HIV]: Secondary | ICD-10-CM

## 2018-01-23 DIAGNOSIS — Z0000 Encounter for general adult medical examination without abnormal findings: Secondary | ICD-10-CM

## 2018-01-23 LAB — BASIC METABOLIC PANEL
BUN: 7 mg/dL (ref 6–23)
CALCIUM: 9.5 mg/dL (ref 8.4–10.5)
CO2: 29 mEq/L (ref 19–32)
Chloride: 104 mEq/L (ref 96–112)
Creatinine, Ser: 0.95 mg/dL (ref 0.40–1.50)
GFR: 93.96 mL/min (ref >60.00)
Glucose, Bld: 96 mg/dL (ref 70–99)
POTASSIUM: 4.2 meq/L (ref 3.5–5.1)
Sodium: 140 mEq/L (ref 135–145)

## 2018-01-23 LAB — LIPID PANEL
Cholesterol: 146 mg/dL (ref 0–200)
HDL: 56.3 mg/dL (ref >39.00)
LDL Cholesterol: 80 mg/dL (ref 0–99)
NonHDL: 89.99
Total CHOL/HDL Ratio: 3
Triglycerides: 51 mg/dL (ref 0.0–149.0)
VLDL: 10.2 mg/dL (ref 0.0–40.0)

## 2018-01-23 LAB — CBC WITH DIFFERENTIAL/PLATELET
Basophils Absolute: 0 10*3/uL (ref 0.0–0.1)
Basophils Relative: 1.1 % (ref 0.0–3.0)
Eosinophils Absolute: 0.2 10*3/uL (ref 0.0–0.7)
Eosinophils Relative: 5.5 % — ABNORMAL HIGH (ref 0.0–5.0)
HEMATOCRIT: 42 % (ref 39.0–52.0)
Hemoglobin: 14.1 g/dL (ref 13.0–17.0)
LYMPHS PCT: 46.6 % — AB (ref 12.0–46.0)
Lymphs Abs: 1.9 10*3/uL (ref 0.7–4.0)
MCHC: 33.6 g/dL (ref 30.0–36.0)
MCV: 87.7 fl (ref 78.0–100.0)
Monocytes Absolute: 0.3 10*3/uL (ref 0.1–1.0)
Monocytes Relative: 6.9 % (ref 3.0–12.0)
Neutro Abs: 1.6 10*3/uL (ref 1.4–7.7)
Neutrophils Relative %: 39.9 % — ABNORMAL LOW (ref 43.0–77.0)
Platelets: 166 10*3/uL (ref 150.0–400.0)
RBC: 4.79 Mil/uL (ref 4.22–5.81)
RDW: 13.6 % (ref 11.5–15.5)
WBC: 4.1 10*3/uL (ref 4.0–10.5)

## 2018-01-23 LAB — HEMOGLOBIN A1C: Hgb A1c MFr Bld: 5.8 % (ref 4.6–6.5)

## 2018-01-23 LAB — HEPATIC FUNCTION PANEL
ALT: 16 U/L (ref 0–53)
AST: 16 U/L (ref 0–37)
Albumin: 4.3 g/dL (ref 3.5–5.2)
Alkaline Phosphatase: 62 U/L (ref 39–117)
Bilirubin, Direct: 0.1 mg/dL (ref 0.0–0.3)
Total Bilirubin: 0.6 mg/dL (ref 0.2–1.2)
Total Protein: 6.9 g/dL (ref 6.0–8.3)

## 2018-01-24 LAB — HIV ANTIBODY (ROUTINE TESTING W REFLEX): HIV 1&2 Ab, 4th Generation: NONREACTIVE
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
## 2018-01-26 ENCOUNTER — Encounter: Payer: 59 | Admitting: Family Medicine

## 2018-01-28 ENCOUNTER — Ambulatory Visit (INDEPENDENT_AMBULATORY_CARE_PROVIDER_SITE_OTHER): Payer: 59 | Admitting: Family Medicine

## 2018-01-28 ENCOUNTER — Encounter: Payer: Self-pay | Admitting: Family Medicine

## 2018-01-28 VITALS — BP 90/70 | HR 67 | Temp 98.3°F | Ht 70.5 in | Wt 155.0 lb

## 2018-01-28 DIAGNOSIS — Z0000 Encounter for general adult medical examination without abnormal findings: Secondary | ICD-10-CM | POA: Diagnosis not present

## 2018-01-28 NOTE — Patient Instructions (Signed)
[employer] - Museum/gallery conservator

## 2019-07-29 ENCOUNTER — Other Ambulatory Visit: Payer: Self-pay | Admitting: Family Medicine

## 2020-11-20 ENCOUNTER — Encounter: Payer: Self-pay | Admitting: Family Medicine

## 2020-11-20 ENCOUNTER — Other Ambulatory Visit: Payer: Self-pay

## 2020-11-20 ENCOUNTER — Ambulatory Visit (INDEPENDENT_AMBULATORY_CARE_PROVIDER_SITE_OTHER): Payer: 59 | Admitting: Family Medicine

## 2020-11-20 VITALS — BP 100/70 | HR 69 | Temp 98.7°F | Ht 70.5 in | Wt 175.0 lb

## 2020-11-20 DIAGNOSIS — R058 Other specified cough: Secondary | ICD-10-CM

## 2020-11-20 DIAGNOSIS — Z20822 Contact with and (suspected) exposure to covid-19: Secondary | ICD-10-CM

## 2020-11-20 DIAGNOSIS — R52 Pain, unspecified: Secondary | ICD-10-CM | POA: Diagnosis not present

## 2020-11-20 LAB — POC INFLUENZA A&B (BINAX/QUICKVUE)
Influenza A, POC: NEGATIVE
Influenza B, POC: NEGATIVE

## 2020-11-20 MED ORDER — DOXYCYCLINE HYCLATE 100 MG PO TABS: 100.0000 mg | ORAL_TABLET | Freq: Two times a day (BID) | ORAL | 0 refills | Status: DC

## 2020-11-20 NOTE — Progress Notes (Signed)
o    Seng Larch T. Sylvio Weatherall, MD, [place] at [facility] [place], [address]  Phone: [phone]  FAX: Ryan - 40 y.o. male  MRN [id number]  Date of Birth: December 21, 1980  Date: 11/20/2020  PCP: Owens Loffler, MD  Referral: Owens Loffler, MD  Chief Complaint  Patient presents with   Cough    With yellow/green phelgm Negative Home Covid test on Friday   Sore Throat    This visit occurred during the SARS-CoV-2 public health emergency.  Safety protocols were in place, including screening questions prior to the visit, additional usage of staff PPE, and extensive cleaning of exam room while observing appropriate contact time as indicated for disinfecting solutions.   Subjective:   Gary Cobb is a 40 y.o. very pleasant male patient with Body mass index is 24.75 kg/m. who presents with the following:  He is a Financial controller.  He presents for an acute visit with a productive cough.  Globally, he does not feel well, he has some polyarthralgias, back pain, sore throat.  He has congestion in his chest and productive cough.  This all started on Friday.  At that time he did do a COVID home test which was negative at day of onset less than 24 hours.  He also did have some sore throat as well as some mid back pain.  The throat has gotten somewhat better.  He has not had any vomiting, no clear diarrhea.  He has also taken some basic over-the-counter's and Tylenol.  Cough and cold with congestion in his chest.  Started on Friday. Friday night he started to take some tylenol.   Phlegm is not going away.  Sore throat.  Back pain.   Allegra and daily Mucinex DM.  Review of Systems is noted in the HPI, as appropriate  Objective:   BP 100/70   Pulse 69   Temp 98.7 F (37.1 C) (Temporal)   Ht 5' 10.5" (1.791 m)   Wt 175 lb (79.4 kg)   SpO2 100%   BMI 24.75 kg/m   GEN: No acute distress;  alert,appropriate.  He does appear somewhat sweaty today in the office. PULM: Breathing comfortably in no respiratory distress PSYCH: Normally interactive.  CV: RRR, no m/g/r  PULM: Normal respiratory rate, no accessory muscle use. No wheezes, he does have diffuse rhonchorous sounds in both lung fields.  Laboratory and Imaging Data: Results for orders placed or performed in visit on 11/20/20  POC Influenza A&B(BINAX/QUICKVUE)  Result Value Ref Range   Influenza A, POC Negative Negative   Influenza B, POC Negative Negative     Assessment and Plan:     ICD-10-CM   1. Suspected COVID-19 virus infection  Z20.822 Novel Coronavirus, NAA ([facility])    Novel Coronavirus, NAA ([facility])    2. Productive cough  R05.8 Novel Coronavirus, NAA ([facility])    POC Influenza A&B(BINAX/QUICKVUE)    Novel Coronavirus, NAA ([facility])    3. Body aches  R52 Novel Coronavirus, NAA ([facility])    POC Influenza A&B(BINAX/QUICKVUE)    Novel Coronavirus, NAA ([facility])     Clinically, the most consistent diagnosis or concerning diagnosis would be COVID-pneumonia.  The home test after 24 hours alone is not very accurate and the false negative rate is very high.  We will check a COVID PCR today.  He is to remain out of work until his [place] testing comes back positive.  Given the sound of his  lungs, I am going to place him on some doxycycline, and if he does end up having COVID then he will stop his doxycycline and start Paxlovid.  Meds ordered this encounter  Medications   doxycycline (VIBRA-TABS) 100 MG tablet    Sig: Take 1 tablet (100 mg total) by mouth 2 (two) times daily.    Dispense:  20 tablet    Refill:  0   There are no discontinued medications. Orders Placed This Encounter  Procedures   Novel Coronavirus, NAA ([facility])   POC Influenza A&B(BINAX/QUICKVUE)    Follow-up: No follow-ups on file.  Dragon [employer] One speech-to-text software was used for transcription in this dictation.  Possible  transcriptional errors can occur using Editor, commissioning.   Signed,  Maud Deed. Amadu Schlageter, MD   Outpatient Encounter Medications as of 11/20/2020  Medication Sig   Cyanocobalamin (VITAMIN B-12 PO) Take 1 tablet by mouth daily.   doxycycline (VIBRA-TABS) 100 MG tablet Take 1 tablet (100 mg total) by mouth 2 (two) times daily.   VITAMIN D PO Take 1 tablet by mouth daily.   No facility-administered encounter medications on file as of 11/20/2020.

## 2020-11-21 LAB — SPECIMEN STATUS REPORT

## 2020-11-21 LAB — SARS-COV-2, NAA 2 DAY TAT

## 2020-11-21 LAB — NOVEL CORONAVIRUS, NAA: SARS-CoV-2, NAA: NOT DETECTED

## 2021-06-18 ENCOUNTER — Encounter: Payer: Self-pay | Admitting: Family Medicine

## 2021-06-18 ENCOUNTER — Telehealth: Payer: Self-pay | Admitting: Family Medicine

## 2021-06-18 ENCOUNTER — Ambulatory Visit (INDEPENDENT_AMBULATORY_CARE_PROVIDER_SITE_OTHER): Payer: 59 | Admitting: Family Medicine

## 2021-06-18 VITALS — BP 94/60 | HR 58 | Temp 98.1°F | Ht 70.5 in | Wt 179.4 lb

## 2021-06-18 DIAGNOSIS — S80212A Abrasion, left knee, initial encounter: Secondary | ICD-10-CM | POA: Diagnosis not present

## 2021-06-18 DIAGNOSIS — S81032A Puncture wound without foreign body, left knee, initial encounter: Secondary | ICD-10-CM | POA: Diagnosis not present

## 2021-06-18 MED ORDER — AMOXICILLIN-POT CLAVULANATE 875-125 MG PO TABS: 1.0000 | ORAL_TABLET | Freq: Two times a day (BID) | ORAL | 0 refills | Status: AC

## 2021-06-18 NOTE — Telephone Encounter (Signed)
done

## 2021-06-18 NOTE — Telephone Encounter (Signed)
T[facility] notified by telephone that Augmentin has been sent to [facility].

## 2021-06-18 NOTE — Telephone Encounter (Signed)
Pt called to follow up on appt, he said Dr Lorelei Pont talked about sending in a medicine for him, he said it was Augmentin and asked if it could be sent to. [facility] on [address]
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
